# Patient Record
Sex: Female | Born: 1940 | Race: White | Hispanic: No | Smoking: Heavy tobacco smoker
Health system: Southern US, Community
[De-identification: ages and names within clinical notes are randomized; demographics above are authoritative.]

## PROBLEM LIST (undated history)

## (undated) HISTORY — PX: PLACEMENT OF BREAST IMPLANTS: SHX6334

## (undated) HISTORY — PX: CATARACT EXTRACTION, BILATERAL: SHX1313

## (undated) HISTORY — PX: ABDOMINAL HYSTERECTOMY: SHX81

---

## 2014-01-17 ENCOUNTER — Inpatient Hospital Stay (HOSPITAL_COMMUNITY): Payer: Medicare Other

## 2014-01-17 ENCOUNTER — Encounter (HOSPITAL_COMMUNITY): Payer: Self-pay

## 2014-01-17 ENCOUNTER — Inpatient Hospital Stay (HOSPITAL_COMMUNITY)
Admission: EM | Admit: 2014-01-17 | Discharge: 2014-02-06 | DRG: 871 | Disposition: E | Payer: Medicare Other | Attending: Internal Medicine | Admitting: Internal Medicine

## 2014-01-17 ENCOUNTER — Emergency Department (HOSPITAL_COMMUNITY): Payer: Medicare Other

## 2014-01-17 DIAGNOSIS — R4182 Altered mental status, unspecified: Secondary | ICD-10-CM | POA: Diagnosis present

## 2014-01-17 DIAGNOSIS — F17210 Nicotine dependence, cigarettes, uncomplicated: Secondary | ICD-10-CM | POA: Diagnosis present

## 2014-01-17 DIAGNOSIS — R579 Shock, unspecified: Secondary | ICD-10-CM | POA: Diagnosis present

## 2014-01-17 DIAGNOSIS — R6521 Severe sepsis with septic shock: Secondary | ICD-10-CM

## 2014-01-17 DIAGNOSIS — Z66 Do not resuscitate: Secondary | ICD-10-CM | POA: Diagnosis present

## 2014-01-17 DIAGNOSIS — E870 Hyperosmolality and hypernatremia: Secondary | ICD-10-CM | POA: Diagnosis present

## 2014-01-17 DIAGNOSIS — R578 Other shock: Secondary | ICD-10-CM | POA: Diagnosis present

## 2014-01-17 DIAGNOSIS — G9340 Encephalopathy, unspecified: Secondary | ICD-10-CM | POA: Diagnosis present

## 2014-01-17 DIAGNOSIS — R0902 Hypoxemia: Secondary | ICD-10-CM | POA: Diagnosis present

## 2014-01-17 DIAGNOSIS — R64 Cachexia: Secondary | ICD-10-CM | POA: Diagnosis present

## 2014-01-17 DIAGNOSIS — W19XXXA Unspecified fall, initial encounter: Secondary | ICD-10-CM

## 2014-01-17 DIAGNOSIS — E872 Acidosis: Secondary | ICD-10-CM | POA: Diagnosis present

## 2014-01-17 DIAGNOSIS — D696 Thrombocytopenia, unspecified: Secondary | ICD-10-CM | POA: Diagnosis present

## 2014-01-17 DIAGNOSIS — A419 Sepsis, unspecified organism: Secondary | ICD-10-CM

## 2014-01-17 DIAGNOSIS — T68XXXA Hypothermia, initial encounter: Secondary | ICD-10-CM | POA: Diagnosis present

## 2014-01-17 DIAGNOSIS — E860 Dehydration: Secondary | ICD-10-CM | POA: Diagnosis present

## 2014-01-17 DIAGNOSIS — Z681 Body mass index (BMI) 19 or less, adult: Secondary | ICD-10-CM

## 2014-01-17 DIAGNOSIS — Z515 Encounter for palliative care: Secondary | ICD-10-CM | POA: Diagnosis not present

## 2014-01-17 DIAGNOSIS — R680 Hypothermia, not associated with low environmental temperature: Secondary | ICD-10-CM | POA: Diagnosis present

## 2014-01-17 DIAGNOSIS — I959 Hypotension, unspecified: Secondary | ICD-10-CM | POA: Diagnosis present

## 2014-01-17 DIAGNOSIS — D689 Coagulation defect, unspecified: Secondary | ICD-10-CM | POA: Diagnosis present

## 2014-01-17 DIAGNOSIS — R40241 Glasgow coma scale score 13-15: Secondary | ICD-10-CM

## 2014-01-17 DIAGNOSIS — R001 Bradycardia, unspecified: Secondary | ICD-10-CM | POA: Diagnosis present

## 2014-01-17 DIAGNOSIS — R531 Weakness: Secondary | ICD-10-CM

## 2014-01-17 LAB — CBC WITH DIFFERENTIAL/PLATELET
Basophils Absolute: 0 10*3/uL (ref 0.0–0.1)
Basophils Relative: 0 % (ref 0–1)
EOS ABS: 0 10*3/uL (ref 0.0–0.7)
Eosinophils Relative: 0 % (ref 0–5)
HEMATOCRIT: 42.4 % (ref 36.0–46.0)
Hemoglobin: 13.2 g/dL (ref 12.0–15.0)
LYMPHS ABS: 0.4 10*3/uL — AB (ref 0.7–4.0)
Lymphocytes Relative: 16 % (ref 12–46)
MCH: 30.5 pg (ref 26.0–34.0)
MCHC: 31.1 g/dL (ref 30.0–36.0)
MCV: 97.9 fL (ref 78.0–100.0)
Monocytes Absolute: 0.1 10*3/uL (ref 0.1–1.0)
Monocytes Relative: 3 % (ref 3–12)
Neutro Abs: 2 10*3/uL (ref 1.7–7.7)
Neutrophils Relative %: 82 % — ABNORMAL HIGH (ref 43–77)
Platelets: 66 10*3/uL — ABNORMAL LOW (ref 150–400)
RBC: 4.33 MIL/uL (ref 3.87–5.11)
RDW: 16.2 % — ABNORMAL HIGH (ref 11.5–15.5)
WBC: 2.4 10*3/uL — ABNORMAL LOW (ref 4.0–10.5)

## 2014-01-17 LAB — COMPREHENSIVE METABOLIC PANEL
ALBUMIN: 2.1 g/dL — AB (ref 3.5–5.2)
ALK PHOS: 75 U/L (ref 39–117)
ALT: 49 U/L — ABNORMAL HIGH (ref 0–35)
AST: 122 U/L — ABNORMAL HIGH (ref 0–37)
Anion gap: 22 — ABNORMAL HIGH (ref 5–15)
BUN: 44 mg/dL — ABNORMAL HIGH (ref 6–23)
CHLORIDE: 110 meq/L (ref 96–112)
CO2: 23 mEq/L (ref 19–32)
Calcium: 8.3 mg/dL — ABNORMAL LOW (ref 8.4–10.5)
Creatinine, Ser: 0.88 mg/dL (ref 0.50–1.10)
GFR calc Af Amer: 74 mL/min — ABNORMAL LOW (ref >90)
GFR calc non Af Amer: 64 mL/min — ABNORMAL LOW (ref >90)
Glucose, Bld: 86 mg/dL (ref 70–99)
POTASSIUM: 3.8 meq/L (ref 3.7–5.3)
Sodium: 155 mEq/L — ABNORMAL HIGH (ref 137–147)
Total Bilirubin: 0.6 mg/dL (ref 0.3–1.2)
Total Protein: 4.2 g/dL — ABNORMAL LOW (ref 6.0–8.3)

## 2014-01-17 LAB — I-STAT CG4 LACTIC ACID, ED
LACTIC ACID, VENOUS: 2.93 mmol/L — AB (ref 0.5–2.2)
Lactic Acid, Venous: 3.26 mmol/L — ABNORMAL HIGH (ref 0.5–2.2)

## 2014-01-17 LAB — I-STAT VENOUS BLOOD GAS, ED
Acid-base deficit: 4 mmol/L — ABNORMAL HIGH (ref 0.0–2.0)
Bicarbonate: 28 mEq/L — ABNORMAL HIGH (ref 20.0–24.0)
O2 Saturation: 72 %
PH VEN: 7.211 — AB (ref 7.250–7.300)
PO2 VEN: 29 mmHg — AB (ref 30.0–45.0)
Patient temperature: 83.3
TCO2: 31 mmol/L (ref 0–100)
pCO2, Ven: 61.8 mmHg — ABNORMAL HIGH (ref 45.0–50.0)

## 2014-01-17 LAB — URINALYSIS, ROUTINE W REFLEX MICROSCOPIC
BILIRUBIN URINE: NEGATIVE
Glucose, UA: NEGATIVE mg/dL
Ketones, ur: NEGATIVE mg/dL
Leukocytes, UA: NEGATIVE
NITRITE: NEGATIVE
Protein, ur: NEGATIVE mg/dL
SPECIFIC GRAVITY, URINE: 1.007 (ref 1.005–1.030)
UROBILINOGEN UA: 0.2 mg/dL (ref 0.0–1.0)
pH: 5 (ref 5.0–8.0)

## 2014-01-17 LAB — DIC (DISSEMINATED INTRAVASCULAR COAGULATION)PANEL
D-Dimer, Quant: 9.17 ug/mL-FEU — ABNORMAL HIGH (ref 0.00–0.48)
Fibrinogen: 174 mg/dL — ABNORMAL LOW (ref 204–475)
Prothrombin Time: 22.4 seconds — ABNORMAL HIGH (ref 11.6–15.2)

## 2014-01-17 LAB — DIC (DISSEMINATED INTRAVASCULAR COAGULATION) PANEL
APTT: 36 s (ref 24–37)
INR: 1.95 — AB (ref 0.00–1.49)
PLATELETS: 72 10*3/uL — AB (ref 150–400)
SMEAR REVIEW: NONE SEEN

## 2014-01-17 LAB — CBG MONITORING, ED: Glucose-Capillary: 76 mg/dL (ref 70–99)

## 2014-01-17 LAB — TROPONIN I: Troponin I: 0.51 ng/mL (ref <0.30)

## 2014-01-17 LAB — URINE MICROSCOPIC-ADD ON

## 2014-01-17 LAB — CK: CK TOTAL: 3257 U/L — AB (ref 7–177)

## 2014-01-17 MED ORDER — VANCOMYCIN HCL IN DEXTROSE 1-5 GM/200ML-% IV SOLN
1000.0000 mg | Freq: Once | INTRAVENOUS | Status: AC
  Administered 2014-01-17: 1000 mg via INTRAVENOUS
  Filled 2014-01-17: qty 200

## 2014-01-17 MED ORDER — DEXTROSE 5 % IV SOLN
2.0000 g | Freq: Once | INTRAVENOUS | Status: AC
  Administered 2014-01-17: 2 g via INTRAVENOUS
  Filled 2014-01-17: qty 2

## 2014-01-17 MED ORDER — VANCOMYCIN HCL IN DEXTROSE 1-5 GM/200ML-% IV SOLN
1000.0000 mg | INTRAVENOUS | Status: DC
  Filled 2014-01-17: qty 200

## 2014-01-17 MED ORDER — SODIUM CHLORIDE 0.9 % IV SOLN: INTRAVENOUS | Status: DC

## 2014-01-17 MED ORDER — SODIUM CHLORIDE 0.9 % IV BOLUS (SEPSIS)
1000.0000 mL | Freq: Once | INTRAVENOUS | Status: DC
  Administered 2014-01-17: 1000 mL via INTRAVENOUS

## 2014-01-17 MED ORDER — LACTATED RINGERS IV BOLUS (SEPSIS)
1000.0000 mL | Freq: Once | INTRAVENOUS | Status: AC
  Administered 2014-01-17: 1000 mL via INTRAVENOUS

## 2014-01-17 MED ORDER — SODIUM CHLORIDE 0.9 % IV BOLUS (SEPSIS): 500.0000 mL | INTRAVENOUS | Status: DC

## 2014-01-17 MED ORDER — SODIUM CHLORIDE 0.9 % IJ SOLN: 3.0000 mL | Freq: Two times a day (BID) | INTRAMUSCULAR | Status: DC

## 2014-01-17 MED ORDER — DEXTROSE 5 % IV SOLN
1.0000 g | INTRAVENOUS | Status: DC
  Filled 2014-01-17: qty 1

## 2014-01-17 NOTE — ED Notes (Signed)
Pt appears more alert her eyes are open now and sometimes she tracks the people in the room

## 2014-01-17 NOTE — ED Notes (Signed)
Dr Denton LankSteinl given a copy of venous blood gas results

## 2014-01-17 NOTE — ED Notes (Signed)
Moving her rt hand and lt arm and hand.  Groaning intermitently

## 2014-01-17 NOTE — ED Notes (Signed)
Pt stopped pacing by Dr. Earlene Plateravis. Family at the bedside.

## 2014-01-17 NOTE — H&P (Addendum)
[facility] History and Physical  Mikki Ziff ZOX:[id number] DOB: 01/12/1941 DOA: 2014-02-09  Referring physician: EDP PCP: No PCP Per Patient   Chief Complaint: Found down   HPI: Marissa Holloway is a 73 y.o. female found down at home at the bottom of her stairs.  Last seen normal about 1 week prior.  Brusing and swelling to face, moving R arm but no verbal response.  Brought in by EMS.  DNR/DNI, initially was paced due to bradycardia in the 20s, initial temperature was so low that they could not measure it, eventually temp of 83 via foley.  Patient was not expected to survive, pacing stopped with family at bedside; however, as the patient has been warmed, her vital signs have continued to improve, and she is now opening her eyes, shaking her head, and squinting.  Her HR is up to the 50s, her BP which was initially 49 systolic is now in the 90s.  Review of Systems: Unable to perform due to AMS.  History reviewed. No pertinent past medical history. Past Surgical History  Procedure Laterality Date  . Abdominal hysterectomy    . Cataract extraction, bilateral    . Placement of breast implants     Social History:  reports that she has been smoking.  She does not have any smokeless tobacco history on file. She reports that she drinks alcohol. Her drug history is not on file.  No Known Allergies  No family history on file.   Prior to Admission medications   Not on File   Physical Exam: Filed Vitals:   02-09-14 2120  BP: 96/44  Pulse: 50  Resp: 11    BP 96/44 mmHg  Pulse 50  Temp(Src)   Resp 11  Ht 5\' 2"  (1.575 m)  Wt 45.36 kg (100 lb)  BMI 18.29 kg/m2  SpO2 97%  General Appearance:    Alert, oriented, no distress, appears stated age  Head:    Normocephalic, atraumatic  Eyes:    PERRL, EOMI, sclera non-icteric        Nose:   Nares without drainage or epistaxis. Mucosa, turbinates normal  Throat:   Moist mucous membranes. Oropharynx without erythema or exudate.   Neck:   Supple. No carotid bruits.  No thyromegaly.  No lymphadenopathy.   Back:     No CVA tenderness, no spinal tenderness  Lungs:     Clear to auscultation bilaterally, without wheezes, rhonchi or rales  Chest wall:    No tenderness to palpitation  Heart:    Regular rate and rhythm without murmurs, gallops, rubs  Abdomen:     Soft, non-tender, nondistended, normal bowel sounds, no organomegaly  Genitalia:    deferred  Rectal:    deferred  Extremities:   No clubbing, cyanosis or edema.  Pulses:   2+ and symmetric all extremities  Skin:   Skin color, texture, turgor normal, no rashes or lesions  Lymph nodes:   Cervical, supraclavicular, and axillary nodes normal  Neurologic:   CNII-XII intact. Normal strength, sensation and reflexes      throughout    Labs on Admission:  Basic Metabolic Panel:  Recent Labs Lab 02/09/14 1930  NA 155*  K 3.8  CL 110  CO2 23  GLUCOSE 86  BUN 44*  CREATININE 0.88  CALCIUM 8.3*   Liver Function Tests:  Recent Labs Lab February 09, 2014 1930  AST 122*  ALT 49*  ALKPHOS 75  BILITOT 0.6  PROT 4.2*  ALBUMIN 2.1*   No results for  input(s): LIPASE, AMYLASE in the last 168 hours. No results for input(s): AMMONIA in the last 168 hours. CBC:  Recent Labs Lab 01/25/2014 1930  WBC 2.4*  NEUTROABS 2.0  HGB 13.2  HCT 42.4  MCV 97.9  PLT 66*   Cardiac Enzymes: No results for input(s): CKTOTAL, CKMB, CKMBINDEX, TROPONINI in the last 168 hours.  BNP (last 3 results) No results for input(s): PROBNP in the last 8760 hours. CBG:  Recent Labs Lab 01/23/2014 1854  GLUCAP 76    Radiological Exams on Admission: Ct Head Wo Contrast  01/25/2014   CLINICAL DATA:  Pt found on floor by family oot at bottom of stairs. Pt is only responsive to pain and movement by opening eyes. Poor historian ,She is unable to answer medical history  EXAM: CT HEAD WITHOUT CONTRAST  CT CERVICAL SPINE WITHOUT CONTRAST  TECHNIQUE: Multidetector CT imaging of the head and  cervical spine was performed following the standard protocol without intravenous contrast. Multiplanar CT image reconstructions of the cervical spine were also generated.  COMPARISON:  None.  FINDINGS: CT HEAD FINDINGS  Ventricles are normal and configuration. There is ventricular and sulcal enlargement reflecting mild, age related, atrophy. No hydrocephalus.  There are no parenchymal masses or mass effect. There is no evidence of a cortical infarct. Mild patchy white matter hypoattenuation is noted consistent with chronic microvascular ischemic change.  There are no extra-axial masses or abnormal fluid collections.  No intracranial hemorrhage.  Left maxillary sinus is opacified. There is mild wall thickening suggesting that this is chronic. There is an air-fluid level in the right maxillary sinus. Fluid level is noted in the right sphenoid sinus and there are fluid levels in the frontal sinuses. There is mild ethmoid sinus mucosal thickening. Clear mastoid air cells.  No skull fracture.  CT CERVICAL SPINE FINDINGS  No fracture. No spondylolisthesis. There are degenerative changes with mild loss disc height at C4-C5 and mild to moderate loss disc height at C5-C6. There is mild endplate irregularity and sclerosis with small endplate spurs. Endplate spurring leads to mild neural foraminal narrowing bilaterally at C5-C6.  Bones are demineralized.  Soft tissues show diffuse subcutaneous soft tissue edema. There are no soft tissue masses or adenopathy.  There are moderate to large left and small moderate right pleural effusions. Lung apices show changes of emphysema as well as interstitial thickening.  IMPRESSION: HEAD CT: No acute intracranial abnormality. No skull fracture. There is significant sinus disease including air-fluid levels as detailed above.  CERVICAL CT: No fracture or acute bony abnormality. Diffuse subcutaneous soft tissue edema. Moderate to large left and small moderate right pleural effusions as well  as interstitial thickening suggesting interstitial edema.  Findings suggest a diffuse edematous state, anasarca. Correlate clinically.   Electronically Signed   By: Amie Portland M.D.   On: 01/15/2014 20:22   Ct Cervical Spine Wo Contrast  01/30/2014   CLINICAL DATA:  Pt found on floor by family oot at bottom of stairs. Pt is only responsive to pain and movement by opening eyes. Poor historian ,She is unable to answer medical history  EXAM: CT HEAD WITHOUT CONTRAST  CT CERVICAL SPINE WITHOUT CONTRAST  TECHNIQUE: Multidetector CT imaging of the head and cervical spine was performed following the standard protocol without intravenous contrast. Multiplanar CT image reconstructions of the cervical spine were also generated.  COMPARISON:  None.  FINDINGS: CT HEAD FINDINGS  Ventricles are normal and configuration. There is ventricular and sulcal enlargement reflecting mild, age related,  atrophy. No hydrocephalus.  There are no parenchymal masses or mass effect. There is no evidence of a cortical infarct. Mild patchy white matter hypoattenuation is noted consistent with chronic microvascular ischemic change.  There are no extra-axial masses or abnormal fluid collections.  No intracranial hemorrhage.  Left maxillary sinus is opacified. There is mild wall thickening suggesting that this is chronic. There is an air-fluid level in the right maxillary sinus. Fluid level is noted in the right sphenoid sinus and there are fluid levels in the frontal sinuses. There is mild ethmoid sinus mucosal thickening. Clear mastoid air cells.  No skull fracture.  CT CERVICAL SPINE FINDINGS  No fracture. No spondylolisthesis. There are degenerative changes with mild loss disc height at C4-C5 and mild to moderate loss disc height at C5-C6. There is mild endplate irregularity and sclerosis with small endplate spurs. Endplate spurring leads to mild neural foraminal narrowing bilaterally at C5-C6.  Bones are demineralized.  Soft tissues show  diffuse subcutaneous soft tissue edema. There are no soft tissue masses or adenopathy.  There are moderate to large left and small moderate right pleural effusions. Lung apices show changes of emphysema as well as interstitial thickening.  IMPRESSION: HEAD CT: No acute intracranial abnormality. No skull fracture. There is significant sinus disease including air-fluid levels as detailed above.  CERVICAL CT: No fracture or acute bony abnormality. Diffuse subcutaneous soft tissue edema. Moderate to large left and small moderate right pleural effusions as well as interstitial thickening suggesting interstitial edema.  Findings suggest a diffuse edematous state, anasarca. Correlate clinically.   Electronically Signed   By: Amie Portlandavid  Ormond M.D.   On: 01/27/2014 20:22   Dg Chest Port 1 View  01/27/2014   CLINICAL DATA:  Found unresponsive following fall down stairs  EXAM: PORTABLE CHEST - 1 VIEW  COMPARISON:  None.  FINDINGS: Cardiac shadow is within normal limits. Calcified breast implants are noted bilaterally. Increased density is noted in the left hemi thorax consistent with a posteriorly layering effusion. No acute bony abnormality is noted.  IMPRESSION: Left pleural effusion.  No other focal abnormality is noted.   Electronically Signed   By: Alcide CleverMark  Lukens M.D.   On: 01/31/2014 19:52    EKG: Independently reviewed.  Assessment/Plan Principal Problem:   Shock Active Problems:   Hypothermia   Bradycardia   Altered mental status   Dehydration with hypernatremia   Thrombocytopenia   1. Shock - unclear cause 1. ABx in case this is sepsis 2. Cultures pending 3. Troponin pending 4. CPK pending, appears pre-renal at this time but creatinine is only 0.88 5. IVF for hypotension, patients BP does appear to be responding favorably at this time 6. Rewarming patient for profound severe hypothermia 7. CT head shows no acute intracranial abnormality, needs MRI if she is found to be having focal neurologic  findings after rewarming. 1. Not candidate for TPA, LKW is 1 week ago. 2. Thrombocytopenia - 1. DIC panel pending 2. SCDs only for DVT ppx. 3. Facial trauma - other than a bloody nose and soft tissue swelling, CT head and neck does not show bony fracture. 4. Bradycardia - improved with rewarming 5. AMS - due to #1, will need to be reassessed neurologically if her status improves.    Code Status: DNR/DNI, confirmed with family, patient was initially expected to pass on after pacer was withdrawn but has improved with supportive measures only as noted above. Family Communication: Family at bedside Disposition Plan: Admit to inpatient   Time  spent: 70 min  GARDNER, JARED M. [facility] Pager [phone]  If 7AM-7PM, please contact the day team taking care of the patient [email or web address] Password [email or web address] 2013/07/09, 9:36 PM

## 2014-01-17 NOTE — ED Notes (Signed)
CBG of 76

## 2014-01-17 NOTE — ED Notes (Signed)
Pt keeping her eyes open more now.  Shaking her head from side to side.  Temp still barely registering.  2nd liter of lactated ringers almost infused

## 2014-01-17 NOTE — ED Notes (Signed)
Per [facility], pt from home. Found on floor by family who is from oot at the bottom of the stairs. Pt only responding to pain. Opens eyes. Extremities blue, cap refill at 30 seconds. Faint carotids, no radials. HR from 25-40 on arrival of EMS at home. IO to right tibia. CBG 73.

## 2014-01-17 NOTE — ED Notes (Signed)
Fluid changed to lactated  Ringers

## 2014-01-17 NOTE — Progress Notes (Addendum)
ANTIBIOTIC CONSULT NOTE - INITIAL  Pharmacy Consult:  Cefepime / Vancomycin Indication:  Sepsis  No Known Allergies  Patient Measurements: Height: 5\' 2"  (157.5 cm) Weight: 100 lb (45.36 kg) IBW/kg (Calculated) : 50.1  Vital Signs: Temp Source: Rectal (12/12 1815) BP: 79/41 mmHg (12/12 2030) Pulse Rate: 47 (12/12 2030)  Labs:  Recent Labs  02/05/2014 1930  WBC 2.4*  HGB 13.2  PLT 66*  CREATININE 0.88   Estimated Creatinine Clearance: 41.4 mL/min (by C-G formula based on Cr of 0.88). No results for input(s): VANCOTROUGH, VANCOPEAK, VANCORANDOM, GENTTROUGH, GENTPEAK, GENTRANDOM, TOBRATROUGH, TOBRAPEAK, TOBRARND, AMIKACINPEAK, AMIKACINTROU, AMIKACIN in the last 72 hours.   Microbiology: No results found for this or any previous visit (from the past 720 hour(s)).  Medical History: History reviewed. No pertinent past medical history.    Assessment: [id number] Y[facility] found her on the floor.  Her HR was in the 20-30s and she was placed on transcutaneous pacing.  Pharmacy consulted to manage cefepime for sepsis.  Noted vancomycin 1gm IV x 1 is ordered.  Baseline labs reviewed.   Goal of Therapy:  Clearance of infection  Vanc trough 15-20 mcg/mL   Plan:  - Cefepime 2gm IV x 1, then 1gm IV Q24H - Vanc 1gm IV Q24H, start tomorrow - Monitor renal fxn, clinical progress    Baltazar Pekala D. Laney Potashang, PharmD, BCPS Pager:  [phone] 01/11/2014, 9:01 PM

## 2014-01-17 NOTE — ED Notes (Signed)
Family at bedside. 

## 2014-01-17 NOTE — ED Notes (Signed)
Critical lab result reported to Dr. Earlene Plateravis, Dr. Lanae BoastGarner, and Thayer Ohmhris, RN

## 2014-01-17 NOTE — ED Notes (Signed)
Dr gardner seeing pt

## 2014-01-17 NOTE — Progress Notes (Signed)
Chaplain walked Pt's daughter Misty StanleyLisa and her husband Onalee HuaDavid to consultation room A. Present while doctors updated them on the patient's condition. Both Onalee Huaavid and Misty StanleyLisa had some time together to decide whether the pt would want some invasive procedures to continue life. Mrs. Misty StanleyLisa and Mr. Onalee HuaDavid agreed that the pt would not want those measures and informed Dr. Earlene Plateravis. Both are currently bedside with the pt speaking with nurses who are present. Chaplain provided emotional support. Currently bedside.   Gala RomneyBrown, Brean Carberry J, Chaplain 2014-01-21

## 2014-01-17 NOTE — ED Notes (Signed)
The pt is in x-ray

## 2014-01-17 NOTE — ED Provider Notes (Signed)
CSN: [id number]     Arrival date & time 06/11/13  1751 History   First MD Initiated Contact with Patient 06/11/13 1814     Chief Complaint  Patient presents with  . Altered Mental Status     (Consider location/radiation/quality/duration/timing/severity/associated sxs/prior Treatment) Patient is a 73 y.o. female presenting with altered mental status. The history is provided by the EMS personnel and a relative.  Altered Mental Status  73 year old female presents via EMS with altered mental status. The patient was last seen normal approximately a week prior. The patient was found at the bottom of her staircase with bruising and swelling to her face, moving her arm, but no verbal response. She initially was found with faintly palpable carotid pulses, and heart rates in the 20s to 30s, she was placed on transcutaneous pacing, and transported here.  On her arrival the patient is obtunded, unable to provide any history.  History reviewed. No pertinent past medical history. Past Surgical History  Procedure Laterality Date  . Abdominal hysterectomy    . Cataract extraction, bilateral    . Placement of breast implants     No family history on file. History  Substance Use Topics  . Smoking status: Heavy Tobacco Smoker  . Smokeless tobacco: Not on file  . Alcohol Use: Yes     Comment: also drank a lot of coffee per daughter   OB History    No data available     Review of Systems  Unable to perform ROS: Mental status change      Allergies  Review of patient's allergies indicates no known allergies.  Home Medications   Prior to Admission medications   Not on File   BP 77/44 mmHg  Pulse 56  Temp(Src)   Resp 18  Ht 5\' 2"  (1.575 m)  Wt 100 lb (45.36 kg)  BMI 18.29 kg/m2  SpO2 98% Physical Exam  Constitutional:  Cachectic, in distress  HENT:  Extensive swelling to the left side of the face, with periorbital bruising  Eyes: Pupils are equal, round, and reactive to light.   Neck:  Cervical collar in place  Cardiovascular:  Cardiac rate, faintly palpable carotid pulses, no palpable femoral or radial pulses  Pulmonary/Chest:  Adequate respiratory effort  Abdominal: Soft. She exhibits no distension. There is no tenderness.  Musculoskeletal:  Moves all extremities  Neurological:  Opens eyes spontaneously, no verbal response, moves all extremities  Skin:  Pale and cold  Nursing note and vitals reviewed.   ED Course  Procedures (including critical care time) Labs Review Labs Reviewed  COMPREHENSIVE METABOLIC PANEL - Abnormal; Notable for the following:    Sodium 155 (*)    BUN 44 (*)    Calcium 8.3 (*)    Total Protein 4.2 (*)    Albumin 2.1 (*)    AST 122 (*)    ALT 49 (*)    GFR calc non Af Amer 64 (*)    GFR calc Af Amer 74 (*)    Anion gap 22 (*)    All other components within normal limits  CBC WITH DIFFERENTIAL - Abnormal; Notable for the following:    WBC 2.4 (*)    RDW 16.2 (*)    Platelets 66 (*)    Neutrophils Relative % 82 (*)    Lymphs Abs 0.4 (*)    All other components within normal limits  URINALYSIS, ROUTINE W REFLEX MICROSCOPIC - Abnormal; Notable for the following:    APPearance CLOUDY (*)  Hgb urine dipstick TRACE (*)    All other components within normal limits  CK - Abnormal; Notable for the following:    Total CK 3257 (*)    All other components within normal limits  DIC (DISSEMINATED INTRAVASCULAR COAGULATION) PANEL - Abnormal; Notable for the following:    Prothrombin Time 22.4 (*)    INR 1.95 (*)    Fibrinogen 174 (*)    Platelets 72 (*)    All other components within normal limits  TROPONIN I - Abnormal; Notable for the following:    Troponin I 0.51 (*)    All other components within normal limits  URINE MICROSCOPIC-ADD ON - Abnormal; Notable for the following:    Squamous Epithelial / LPF MANY (*)    Casts HYALINE CASTS (*)    All other components within normal limits  I-STAT CG4 LACTIC ACID, ED -  Abnormal; Notable for the following:    Lactic Acid, Venous 2.93 (*)    All other components within normal limits  I-STAT VENOUS BLOOD GAS, ED - Abnormal; Notable for the following:    pH, Ven 7.211 (*)    pCO2, Ven 61.8 (*)    pO2, Ven 29.0 (*)    Bicarbonate 28.0 (*)    Acid-base deficit 4.0 (*)    All other components within normal limits  CULTURE, BLOOD (ROUTINE X 2)  CULTURE, BLOOD (ROUTINE X 2)  URINE CULTURE  BLOOD GAS, VENOUS  CBC  BASIC METABOLIC PANEL  LACTIC ACID, PLASMA  TROPONIN I  TROPONIN I  TROPONIN I  CBG MONITORING, ED  I-STAT CG4 LACTIC ACID, ED  I-STAT CG4 LACTIC ACID, ED    Imaging Review Ct Head Wo Contrast  08-05-2013   CLINICAL DATA:  Pt found on floor by family oot at bottom of stairs. Pt is only responsive to pain and movement by opening eyes. Poor historian ,She is unable to answer medical history  EXAM: CT HEAD WITHOUT CONTRAST  CT CERVICAL SPINE WITHOUT CONTRAST  TECHNIQUE: Multidetector CT imaging of the head and cervical spine was performed following the standard protocol without intravenous contrast. Multiplanar CT image reconstructions of the cervical spine were also generated.  COMPARISON:  None.  FINDINGS: CT HEAD FINDINGS  Ventricles are normal and configuration. There is ventricular and sulcal enlargement reflecting mild, age related, atrophy. No hydrocephalus.  There are no parenchymal masses or mass effect. There is no evidence of a cortical infarct. Mild patchy white matter hypoattenuation is noted consistent with chronic microvascular ischemic change.  There are no extra-axial masses or abnormal fluid collections.  No intracranial hemorrhage.  Left maxillary sinus is opacified. There is mild wall thickening suggesting that this is chronic. There is an air-fluid level in the right maxillary sinus. Fluid level is noted in the right sphenoid sinus and there are fluid levels in the frontal sinuses. There is mild ethmoid sinus mucosal thickening. Clear  mastoid air cells.  No skull fracture.  CT CERVICAL SPINE FINDINGS  No fracture. No spondylolisthesis. There are degenerative changes with mild loss disc height at C4-C5 and mild to moderate loss disc height at C5-C6. There is mild endplate irregularity and sclerosis with small endplate spurs. Endplate spurring leads to mild neural foraminal narrowing bilaterally at C5-C6.  Bones are demineralized.  Soft tissues show diffuse subcutaneous soft tissue edema. There are no soft tissue masses or adenopathy.  There are moderate to large left and small moderate right pleural effusions. Lung apices show changes of emphysema as well as  interstitial thickening.  IMPRESSION: HEAD CT: No acute intracranial abnormality. No skull fracture. There is significant sinus disease including air-fluid levels as detailed above.  CERVICAL CT: No fracture or acute bony abnormality. Diffuse subcutaneous soft tissue edema. Moderate to large left and small moderate right pleural effusions as well as interstitial thickening suggesting interstitial edema.  Findings suggest a diffuse edematous state, anasarca. Correlate clinically.   Electronically Signed   By: Amie Portland M.D.   On: 01/27/2014 20:22   Ct Cervical Spine Wo Contrast  01/16/2014   CLINICAL DATA:  Pt found on floor by family oot at bottom of stairs. Pt is only responsive to pain and movement by opening eyes. Poor historian ,She is unable to answer medical history  EXAM: CT HEAD WITHOUT CONTRAST  CT CERVICAL SPINE WITHOUT CONTRAST  TECHNIQUE: Multidetector CT imaging of the head and cervical spine was performed following the standard protocol without intravenous contrast. Multiplanar CT image reconstructions of the cervical spine were also generated.  COMPARISON:  None.  FINDINGS: CT HEAD FINDINGS  Ventricles are normal and configuration. There is ventricular and sulcal enlargement reflecting mild, age related, atrophy. No hydrocephalus.  There are no parenchymal masses or mass  effect. There is no evidence of a cortical infarct. Mild patchy white matter hypoattenuation is noted consistent with chronic microvascular ischemic change.  There are no extra-axial masses or abnormal fluid collections.  No intracranial hemorrhage.  Left maxillary sinus is opacified. There is mild wall thickening suggesting that this is chronic. There is an air-fluid level in the right maxillary sinus. Fluid level is noted in the right sphenoid sinus and there are fluid levels in the frontal sinuses. There is mild ethmoid sinus mucosal thickening. Clear mastoid air cells.  No skull fracture.  CT CERVICAL SPINE FINDINGS  No fracture. No spondylolisthesis. There are degenerative changes with mild loss disc height at C4-C5 and mild to moderate loss disc height at C5-C6. There is mild endplate irregularity and sclerosis with small endplate spurs. Endplate spurring leads to mild neural foraminal narrowing bilaterally at C5-C6.  Bones are demineralized.  Soft tissues show diffuse subcutaneous soft tissue edema. There are no soft tissue masses or adenopathy.  There are moderate to large left and small moderate right pleural effusions. Lung apices show changes of emphysema as well as interstitial thickening.  IMPRESSION: HEAD CT: No acute intracranial abnormality. No skull fracture. There is significant sinus disease including air-fluid levels as detailed above.  CERVICAL CT: No fracture or acute bony abnormality. Diffuse subcutaneous soft tissue edema. Moderate to large left and small moderate right pleural effusions as well as interstitial thickening suggesting interstitial edema.  Findings suggest a diffuse edematous state, anasarca. Correlate clinically.   Electronically Signed   By: Amie Portland M.D.   On: 01/11/2014 20:22   Dg Chest Port 1 View  01/13/2014   CLINICAL DATA:  Found unresponsive following fall down stairs  EXAM: PORTABLE CHEST - 1 VIEW  COMPARISON:  None.  FINDINGS: Cardiac shadow is within normal  limits. Calcified breast implants are noted bilaterally. Increased density is noted in the left hemi thorax consistent with a posteriorly layering effusion. No acute bony abnormality is noted.  IMPRESSION: Left pleural effusion.  No other focal abnormality is noted.   Electronically Signed   By: Alcide Clever M.D.   On: 02/02/2014 19:52     EKG Interpretation None      MDM   Final diagnoses:  Altered mental status  Weakness  Septic shock  Hypothermia, initial encounter  Hypoxia   73 year old female presents in distress. Upon arrival, patient is being transferred to the paced by EMS. During movement to the hospital bed, the pads became dislodged and we lost capture. We had difficulty getting pads on to the patient, but when pediatric pads are applied, the pacemaker was restarted with good capture, and femoral pulses were again palpable. She never lost consciousness. She is hypothermic, obtunded, and appears in distress. Active rewarming was started, and vascular access was obtained. She was given boluses of warm fluids.  Shortly after she arrived in the department, family members arrived here and I discussed goals of care with them. Given the patient's grave condition, the family feels as though comfort measures would be most in keeping with the patient's wishes. They specifically requested no intubation, no CPR, and to focus on comfort care.  I discussed discontinuing external pacing, which they felt was reasonable.  The patient persistently hypotensive, and we are giving fluid boluses. She is being actively rewarmed, and seems have some improvement in her mental status. We discussed what she would want done should she improving condition, and they feel like if she were to have a good prognosis for some quality of life, she would want medical interventions. We will plan to get a CT of her head and spine as she does have evidence of trauma, but will not plan to intervene on any of these findings  unless her clinical course spontaneously improves with rewarming.  After rewarming, patient has begun having a mild improvement in blood pressure and mental status. Discussed this with the family, and they feel that they would like to pursue slightly more aggressive medical course at this point, due to the improvement in her condition. Will workup for sepsis and start broad-spectrum antibiotics for sepsis of unknown known etiology.  Hospitalist consulted for admission.  Erskine Emery, MD 23-Jan-2014 1610  Suzi Roots, MD 01/23/2014 [phone]

## 2014-01-17 NOTE — ED Notes (Signed)
Temp 88.7  Foley wire

## 2014-01-18 LAB — LACTIC ACID, PLASMA: LACTIC ACID, VENOUS: 5.4 mmol/L — AB (ref 0.5–2.2)

## 2014-01-18 LAB — BASIC METABOLIC PANEL
Anion gap: 16 — ABNORMAL HIGH (ref 5–15)
BUN: 43 mg/dL — ABNORMAL HIGH (ref 6–23)
CALCIUM: 7.6 mg/dL — AB (ref 8.4–10.5)
CO2: 27 mEq/L (ref 19–32)
Chloride: 113 mEq/L — ABNORMAL HIGH (ref 96–112)
Creatinine, Ser: 1.04 mg/dL (ref 0.50–1.10)
GFR, EST AFRICAN AMERICAN: 61 mL/min — AB (ref >90)
GFR, EST NON AFRICAN AMERICAN: 52 mL/min — AB (ref >90)
Glucose, Bld: 59 mg/dL — ABNORMAL LOW (ref 70–99)
POTASSIUM: 5.7 meq/L — AB (ref 3.7–5.3)
SODIUM: 156 meq/L — AB (ref 137–147)

## 2014-01-18 LAB — CBC
HCT: 38.2 % (ref 36.0–46.0)
Hemoglobin: 11 g/dL — ABNORMAL LOW (ref 12.0–15.0)
MCH: 28.9 pg (ref 26.0–34.0)
MCHC: 28.8 g/dL — AB (ref 30.0–36.0)
MCV: 100.5 fL — ABNORMAL HIGH (ref 78.0–100.0)
PLATELETS: 92 10*3/uL — AB (ref 150–400)
RBC: 3.8 MIL/uL — ABNORMAL LOW (ref 3.87–5.11)
RDW: 16.6 % — AB (ref 11.5–15.5)
WBC: 3 10*3/uL — ABNORMAL LOW (ref 4.0–10.5)

## 2014-01-18 LAB — TROPONIN I
Troponin I: 0.74 ng/mL (ref <0.30)
Troponin I: 0.65 ng/mL (ref <0.30)

## 2014-01-18 MED ORDER — MORPHINE SULFATE 2 MG/ML IJ SOLN: 1.0000 mg | INTRAMUSCULAR | Status: DC | PRN

## 2014-01-18 MED ORDER — SCOPOLAMINE 1 MG/3DAYS TD PT72
1.0000 | MEDICATED_PATCH | TRANSDERMAL | Status: DC
  Filled 2014-01-18: qty 1

## 2014-01-20 LAB — URINE CULTURE: Colony Count: 5000

## 2014-01-24 LAB — CULTURE, BLOOD (ROUTINE X 2)
CULTURE: NO GROWTH
Culture: NO GROWTH

## 2014-02-06 NOTE — ED Notes (Signed)
The pt remains the same.  bp low no urine output   No response to  aything except attempting to check her pupils.  Both equal and react to light.  approx  Size 2.0

## 2014-02-06 NOTE — ED Notes (Signed)
The pts breathing is more shallow.  Not making any noise or shaking her head as she once was

## 2014-02-06 NOTE — ED Notes (Signed)
pts bed is being changed to a reg bed for the pts comfort

## 2014-02-06 NOTE — Progress Notes (Signed)
Chaplain followed up with family per previous chaplain request. Pt family, daughter Misty StanleyLisa and son-in-law, in hallway and teary. Pt daughter and son-in-law just came in from [place] for the holidays. Pt daughter expressed difficulty of knowing her mother wants to be DNR (through discussions last year and pt shaking head tonight). Pt brother may be coming in from [place]. Faith very important to pt family and say "our church is praying as are other people." Chaplain walked with family to 6N, offered emotional support, facilitated storytelling, and offered prayer. Chaplain made family aware of services should they need them. Page chaplain as needed.   01/28/2014 0200  Clinical Encounter Type  Visited With Family  Visit Type Follow-up;Spiritual support;Patient actively dying  [facility]  Spiritual Encounters  Spiritual Needs Grief support;Emotional;Prayer  Stress Factors  Family Stress Factors Loss  Sahan Pen, Mayer MaskerCourtney F, Chaplain 01/21/2014 2:46 AM

## 2014-02-06 NOTE — ED Notes (Signed)
The pt returned from xray 

## 2014-02-06 NOTE — Discharge Summary (Signed)
Death Summary  Marissa MutterSharon Holloway ZOX:[id number] DOB: [birth year] DOA: 11-06-2013  PCP: No PCP Per Patient PCP/Office notified:   Admit date: 11-06-2013 Date of Death: 01/26/2014  Final Diagnoses:  Principal Problem:   Shock Active Problems:   Hypothermia   Bradycardia   Altered mental status   Dehydration with hypernatremia   Thrombocytopenia    History of present illness:  Marissa Holloway is a 74 y.o. female found down at home at the bottom of her stairs. Last seen normal about 1 week prior. Brusing and swelling to face, moving R arm but no verbal response. Brought in by EMS. DNR/DNI, initially was paced due to bradycardia in the 20s, initial temperature was so low that they could not measure it, eventually temp of 83 via foley. Patient was not expected to survive, pacing stopped with family at bedside; however, as the patient has been warmed, her vital signs have continued to improve, and she is now opening her eyes, shaking her head, and squinting. Her HR is up to the 50s, her BP which was initially 49 systolic is now in the 90s  Hospital Course:  Patient admitted earlier today by my colleague Dr. Julian ReilGardner, patient expired before I was able to see her at 8:23 AM. I received a call from RN after the confirmed the patient death. Patient was full comfort measures as far as a no she was surrounded by family at the time of death.    Time of death: 8:23 AM  Signed:  Donne Baley A  [facility] 01/15/2014, 11:30 AM

## 2014-02-06 NOTE — Significant Event (Signed)
During re-evaluation after X rays, patient is desaturating down into the low 70s, bradycardic into the 40s, and hypotension has worsened with BP of 64/35.  She is also doing less neurologically at this time, agonal respirations.  Unfortunately it does not look like patient is oxygenating well despite 100% NRB.  Attempted to open air way by tiling neck with no benefit.  Re-confirmed once again with family the patients wishes, and they once again make it clear that patient is DNR/DNI, they understand that without intubation patient is likely to pass-on but express that this is unquestionably the patients wish as patient has expressed to them in the past that she does not want intubation.  Prognosis is very grim at this point, patient likely to pass on in the next couple of minutes to hours.

## 2014-02-06 NOTE — Progress Notes (Signed)
Patient's daughter in room. Stated that mother had stopped breathing. No respirations or pulses auscultated or palpated. Pronounced by Yvone NeuBrenda Hall, RN and Trina Aoarla Berthold Glace, RN. Dr. Arthor CaptainElmahi notified.  Donor Services contacted- declined due to age. Medical Examiner called and declined case due to negative scans. Trina Aoarla Riyana Biel, RN

## 2014-02-06 NOTE — ED Notes (Signed)
Report given to rn on 6n      

## 2014-02-06 NOTE — ED Notes (Signed)
Dr gardener at the bedside

## 2014-02-06 NOTE — ED Notes (Signed)
The pt continues to have rapid respirations.  No  Response to any movement or noise.  Family remains at the bedside.  No urine output

## 2014-02-06 NOTE — Significant Event (Signed)
Family would like all lab draws discontinued at this point, goals of care are to focus on patient comfort.  Patient is actively dying.

## 2014-02-06 NOTE — ED Notes (Signed)
No urinary output  

## 2014-02-06 DEATH — deceased

## 2015-11-27 IMAGING — DX DG THORACIC SPINE 2V
2 series · 2 of 2 positions shown · non-contrast
Comparison: None.

CLINICAL DATA: Fall today.

EXAM:
THORACIC SPINE - 2 VIEW

[t-spine ap]
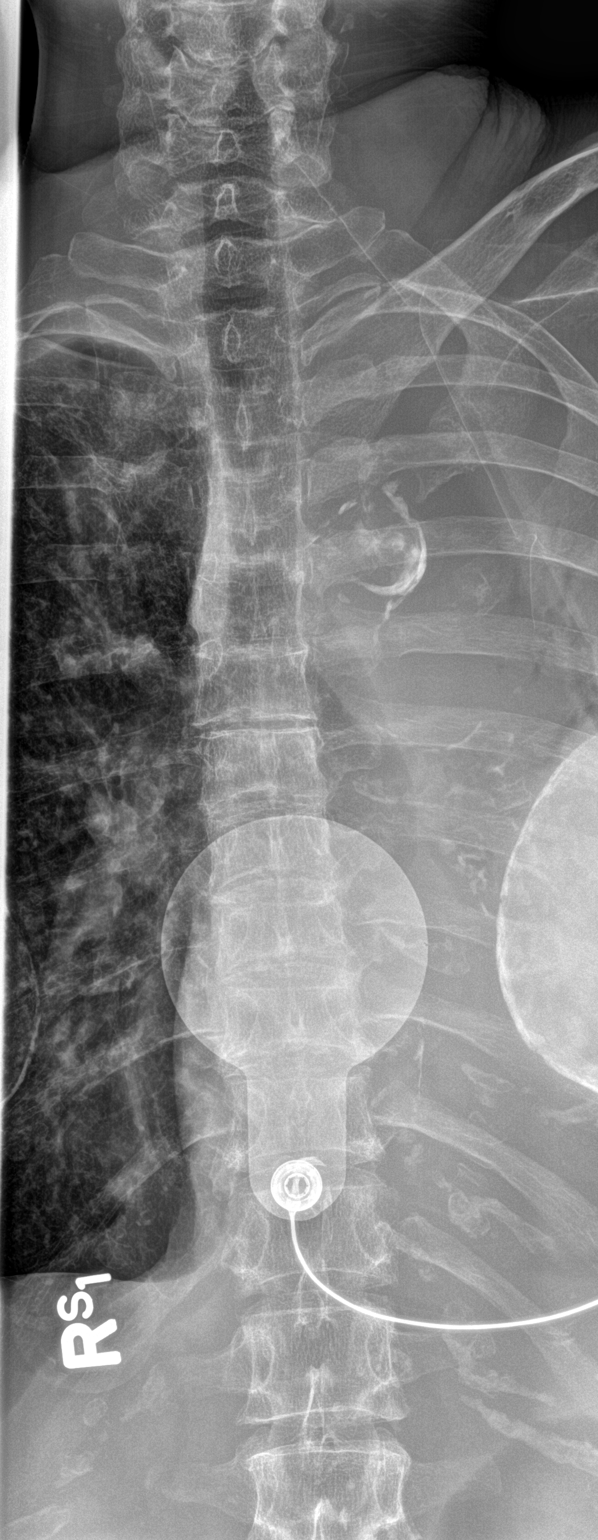

[t-spine lat]
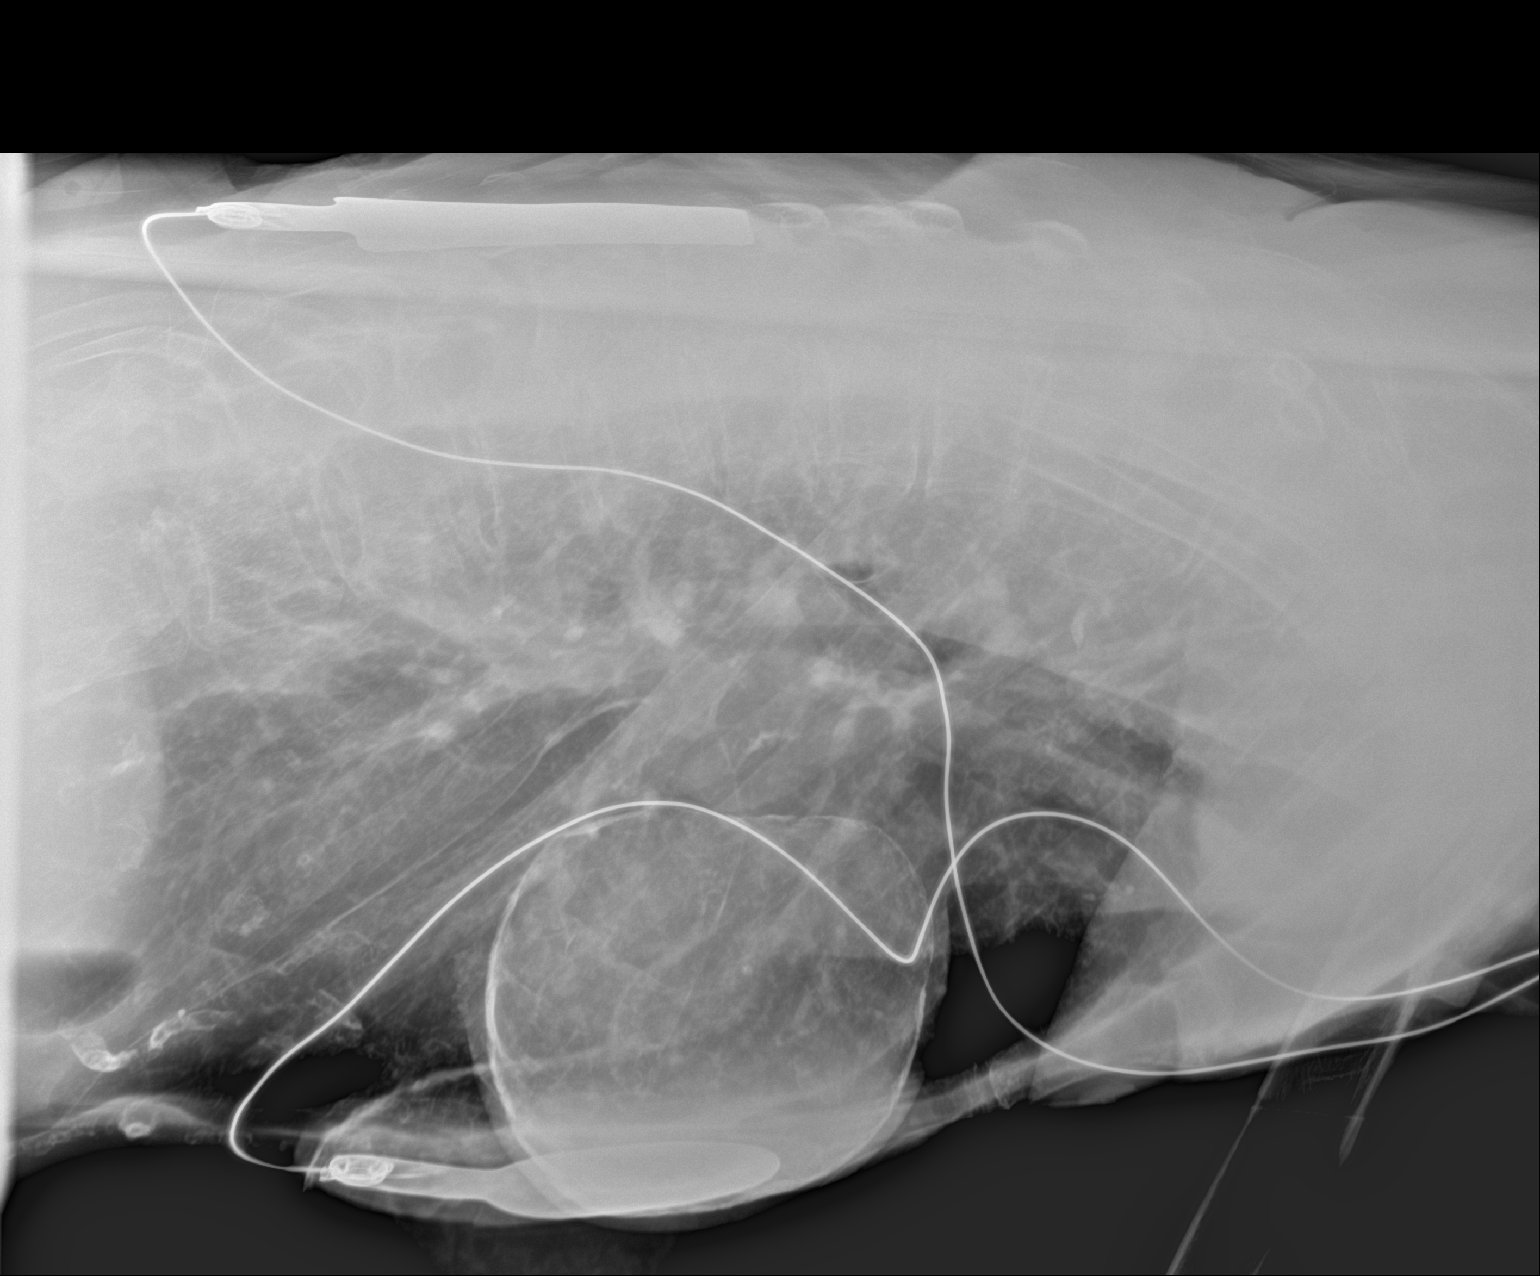

[2 of 2 positions shown; findings below may reference images not displayed]

FINDINGS: Vertebral body alignment is within normal. There is very subtle
anterior wedging of an upper thoracic vertebral body likely chronic.
Mild spondylosis of the thoracic spine. Moderate calcified plaque
over the thoracic aorta. Opacification over the central left lung.
IMPRESSION: Mild spondylosis of the thoracic spine with minimal anterior wedging
of an upper thoracic vertebral body likely chronic.
